# Patient Record
Sex: Male | Born: 1955 | Race: White | Hispanic: No | Smoking: Never smoker
Health system: Southern US, Community
[De-identification: ages and names within clinical notes are randomized; demographics above are authoritative.]

## PROBLEM LIST (undated history)

## (undated) DIAGNOSIS — I639 Cerebral infarction, unspecified: Secondary | ICD-10-CM

## (undated) DIAGNOSIS — I2510 Atherosclerotic heart disease of native coronary artery without angina pectoris: Secondary | ICD-10-CM

## (undated) HISTORY — PX: HERNIA REPAIR: SHX51

---

## 2017-05-20 ENCOUNTER — Emergency Department (HOSPITAL_COMMUNITY): Payer: Medicare Other

## 2017-05-20 ENCOUNTER — Emergency Department (HOSPITAL_COMMUNITY)
Admission: EM | Admit: 2017-05-20 | Discharge: 2017-05-20 | Disposition: A | Discharge status: Other Acute Inpatient Hospital | Payer: Medicare Other | Attending: Emergency Medicine | Admitting: Emergency Medicine

## 2017-05-20 ENCOUNTER — Other Ambulatory Visit: Payer: Self-pay

## 2017-05-20 ENCOUNTER — Encounter (HOSPITAL_COMMUNITY): Payer: Self-pay | Admitting: Emergency Medicine

## 2017-05-20 DIAGNOSIS — Z7902 Long term (current) use of antithrombotics/antiplatelets: Secondary | ICD-10-CM | POA: Diagnosis not present

## 2017-05-20 DIAGNOSIS — Z794 Long term (current) use of insulin: Secondary | ICD-10-CM | POA: Insufficient documentation

## 2017-05-20 DIAGNOSIS — I259 Chronic ischemic heart disease, unspecified: Secondary | ICD-10-CM | POA: Insufficient documentation

## 2017-05-20 DIAGNOSIS — E1165 Type 2 diabetes mellitus with hyperglycemia: Secondary | ICD-10-CM | POA: Insufficient documentation

## 2017-05-20 DIAGNOSIS — R739 Hyperglycemia, unspecified: Secondary | ICD-10-CM

## 2017-05-20 DIAGNOSIS — F419 Anxiety disorder, unspecified: Secondary | ICD-10-CM | POA: Insufficient documentation

## 2017-05-20 DIAGNOSIS — I10 Essential (primary) hypertension: Secondary | ICD-10-CM | POA: Diagnosis not present

## 2017-05-20 DIAGNOSIS — R45851 Suicidal ideations: Secondary | ICD-10-CM | POA: Diagnosis not present

## 2017-05-20 DIAGNOSIS — Z79899 Other long term (current) drug therapy: Secondary | ICD-10-CM | POA: Insufficient documentation

## 2017-05-20 DIAGNOSIS — F209 Schizophrenia, unspecified: Secondary | ICD-10-CM | POA: Insufficient documentation

## 2017-05-20 HISTORY — DX: Atherosclerotic heart disease of native coronary artery without angina pectoris: I25.10

## 2017-05-20 HISTORY — DX: Cerebral infarction, unspecified: I63.9

## 2017-05-20 LAB — COMPREHENSIVE METABOLIC PANEL
ALBUMIN: 3.6 g/dL (ref 3.5–5.0)
ALK PHOS: 101 U/L (ref 38–126)
ALT: 18 U/L (ref 17–63)
AST: 18 U/L (ref 15–41)
Anion gap: 11 (ref 5–15)
BILIRUBIN TOTAL: 0.9 mg/dL (ref 0.3–1.2)
BUN: 22 mg/dL — ABNORMAL HIGH (ref 6–20)
CHLORIDE: 106 mmol/L (ref 101–111)
CO2: 21 mmol/L — AB (ref 22–32)
Calcium: 9.1 mg/dL (ref 8.9–10.3)
Creatinine, Ser: 0.85 mg/dL (ref 0.61–1.24)
GFR calc Af Amer: >60 mL/min (ref >60)
GFR calc non Af Amer: >60 mL/min (ref >60)
Glucose, Bld: 254 mg/dL — ABNORMAL HIGH (ref 65–99)
Potassium: 3.6 mmol/L (ref 3.5–5.1)
Sodium: 138 mmol/L (ref 135–145)
Total Protein: 6.8 g/dL (ref 6.5–8.1)

## 2017-05-20 LAB — CBG MONITORING, ED
GLUCOSE-CAPILLARY: 249 mg/dL — AB (ref 65–99)
Glucose-Capillary: 236 mg/dL — ABNORMAL HIGH (ref 65–99)
Glucose-Capillary: 237 mg/dL — ABNORMAL HIGH (ref 65–99)

## 2017-05-20 LAB — RAPID URINE DRUG SCREEN, HOSP PERFORMED
AMPHETAMINES: NOT DETECTED
Barbiturates: NOT DETECTED
Benzodiazepines: NOT DETECTED
COCAINE: NOT DETECTED
Opiates: NOT DETECTED
TETRAHYDROCANNABINOL: NOT DETECTED

## 2017-05-20 LAB — URINALYSIS, ROUTINE W REFLEX MICROSCOPIC
Bilirubin Urine: NEGATIVE
KETONES UR: NEGATIVE mg/dL
LEUKOCYTES UA: NEGATIVE
Nitrite: NEGATIVE
Protein, ur: 100 mg/dL — AB
SQUAMOUS EPITHELIAL / LPF: NONE SEEN
Specific Gravity, Urine: 1.032 — ABNORMAL HIGH (ref 1.005–1.030)
pH: 5 (ref 5.0–8.0)

## 2017-05-20 LAB — CBC
HCT: 39.4 % (ref 39.0–52.0)
HEMOGLOBIN: 13.6 g/dL (ref 13.0–17.0)
MCH: 28.9 pg (ref 26.0–34.0)
MCHC: 34.5 g/dL (ref 30.0–36.0)
MCV: 83.8 fL (ref 78.0–100.0)
PLATELETS: 160 10*3/uL (ref 150–400)
RBC: 4.7 MIL/uL (ref 4.22–5.81)
RDW: 14.1 % (ref 11.5–15.5)
WBC: 8.8 10*3/uL (ref 4.0–10.5)

## 2017-05-20 LAB — SALICYLATE LEVEL: Salicylate Lvl: <7 mg/dL (ref 2.8–30.0)

## 2017-05-20 LAB — ACETAMINOPHEN LEVEL

## 2017-05-20 LAB — ETHANOL

## 2017-05-20 LAB — I-STAT TROPONIN, ED: Troponin i, poc: 0 ng/mL (ref 0.00–0.08)

## 2017-05-20 MED ORDER — INSULIN ASPART 100 UNIT/ML ~~LOC~~ SOLN
0.0000 [IU] | Freq: Three times a day (TID) | SUBCUTANEOUS | Status: DC
  Administered 2017-05-20 (×3): 5 [IU] via SUBCUTANEOUS

## 2017-05-20 MED ORDER — PRAZOSIN HCL 1 MG PO CAPS
1.0000 mg | ORAL_CAPSULE | Freq: Every day | ORAL | Status: DC
  Filled 2017-05-20: qty 1

## 2017-05-20 MED ORDER — DULOXETINE HCL 30 MG PO CPEP
60.0000 mg | ORAL_CAPSULE | Freq: Two times a day (BID) | ORAL | Status: DC
  Administered 2017-05-20: 60 mg via ORAL
  Filled 2017-05-20: qty 2

## 2017-05-20 MED ORDER — INSULIN GLARGINE 100 UNIT/ML ~~LOC~~ SOLN
60.0000 [IU] | Freq: Every day | SUBCUTANEOUS | Status: DC
  Filled 2017-05-20: qty 0.6

## 2017-05-20 MED ORDER — INSULIN ASPART 100 UNIT/ML ~~LOC~~ SOLN
0.0000 [IU] | Freq: Three times a day (TID) | SUBCUTANEOUS | Status: DC
  Administered 2017-05-20: 5 [IU] via SUBCUTANEOUS
  Filled 2017-05-20: qty 1

## 2017-05-20 MED ORDER — GABAPENTIN 400 MG PO CAPS
800.0000 mg | ORAL_CAPSULE | Freq: Three times a day (TID) | ORAL | Status: DC
  Administered 2017-05-20 (×2): 800 mg via ORAL
  Filled 2017-05-20 (×2): qty 2

## 2017-05-20 MED ORDER — CLOPIDOGREL BISULFATE 75 MG PO TABS
75.0000 mg | ORAL_TABLET | Freq: Every day | ORAL | Status: DC
  Administered 2017-05-20: 75 mg via ORAL
  Filled 2017-05-20: qty 1

## 2017-05-20 MED ORDER — ACETAMINOPHEN 325 MG PO TABS: 650.0000 mg | ORAL_TABLET | ORAL | Status: DC | PRN

## 2017-05-20 MED ORDER — BISMUTH SUBSALICYLATE 262 MG/15ML PO SUSP
30.0000 mL | Freq: Once | ORAL | Status: AC
  Administered 2017-05-20: 30 mL via ORAL
  Filled 2017-05-20: qty 236

## 2017-05-20 MED ORDER — TRAZODONE HCL 100 MG PO TABS: 100.0000 mg | ORAL_TABLET | Freq: Every day | ORAL | Status: DC

## 2017-05-20 MED ORDER — LISINOPRIL 10 MG PO TABS
10.0000 mg | ORAL_TABLET | Freq: Every day | ORAL | Status: DC
  Administered 2017-05-20: 10 mg via ORAL
  Filled 2017-05-20: qty 1

## 2017-05-20 MED ORDER — ATORVASTATIN CALCIUM 40 MG PO TABS
40.0000 mg | ORAL_TABLET | Freq: Every day | ORAL | Status: DC
  Administered 2017-05-20: 40 mg via ORAL
  Filled 2017-05-20: qty 1

## 2017-05-20 MED ORDER — BUPROPION HCL 75 MG PO TABS
75.0000 mg | ORAL_TABLET | Freq: Every day | ORAL | Status: DC
  Administered 2017-05-20: 75 mg via ORAL
  Filled 2017-05-20: qty 1

## 2017-05-20 MED ORDER — METFORMIN HCL 500 MG PO TABS
1000.0000 mg | ORAL_TABLET | Freq: Two times a day (BID) | ORAL | Status: DC
  Administered 2017-05-20 (×2): 1000 mg via ORAL
  Filled 2017-05-20 (×2): qty 2

## 2017-05-20 NOTE — ED Notes (Addendum)
[employer]iffs [employer] here to transport patient to [facility]
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
## 2017-05-20 NOTE — BH Assessment (Addendum)
[facility] Assessment Progress Note  Per Thedore MinsMojeed Akintayo, MD, this pt requires psychiatric hospitalization at this time.  EDP Drema PryPedro Cardama, MD, finds that this pt meets criteria for IVC, which he has initiated.  [place] [facility] [facility], and at 13:46 Hortense RamalMagistrate Watts confirms receipt.  As of this writing, service of Findings and Custody Order is pending.  At 12:35 Christiane HaJonathan calls from [facility] to report that pt has been accepted to their facility by Dr Wendall StadeKohl.  Dorise HissLaurie Britton, [facility], FNP concurs with this decision.  Pt's nurse, Diane, has been notified, and agrees to call report to [phone].  [facility].  Doylene Canninghomas [place] Behavioral Health Coordinator [phone]   Addendum:  IVC documents have been fax[facility] to [facility], per their request, and at 14:00 Christiane HaJonathan confirms receipt.  Doylene Canninghomas [place] Hijazi, [place] Behavioral Health Coordinator [phone]

## 2017-05-20 NOTE — ED Notes (Signed)
Pt calm and cooperative. Somewhat guarded and irritable. Affect blunted, mood depressed.
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
## 2017-05-20 NOTE — ED Notes (Signed)
Called report to [facility] and Called [facility] for transport. Sheriff said that it would be several hours before they can transport.

## 2017-05-20 NOTE — ED Triage Notes (Signed)
[place]  EMS was called out by [employer] for an unconscious person  [employer] thought the pt threw himself on the ground  Pt was alert and oriented x 4 when EMS arrived  Pt states he blacks out when he gets really anxious  Pt threw his body against EMS personal during their assessment then to the floor  Pt states [facility] under [facility] pretenses  The man never picked him up when he arrived here  Pt wants to be seen for his anxiety  Pt has not eaten or had any water to take his medications today

## 2017-05-20 NOTE — ED Provider Notes (Signed)
Patient's EMTALA has been updated.  He is going to [facility].  He has remained stable and appears well overall stable for transport.   Pricilla LovelessGoldston, Jayda White, MD 05/20/17 [phone]

## 2017-05-20 NOTE — ED Triage Notes (Addendum)
Pt reports having severe anxiety attack. Pt also reports having suicidal ideation of overdosing.

## 2019-01-06 IMAGING — CR DG FOOT COMPLETE 3+V*R*
3 series · 3 of 3 positions shown · non-contrast
Comparison: None.

CLINICAL DATA: Right foot neuropathy.

EXAM:
RIGHT FOOT COMPLETE - 3+ VIEW

[x foot ap right]
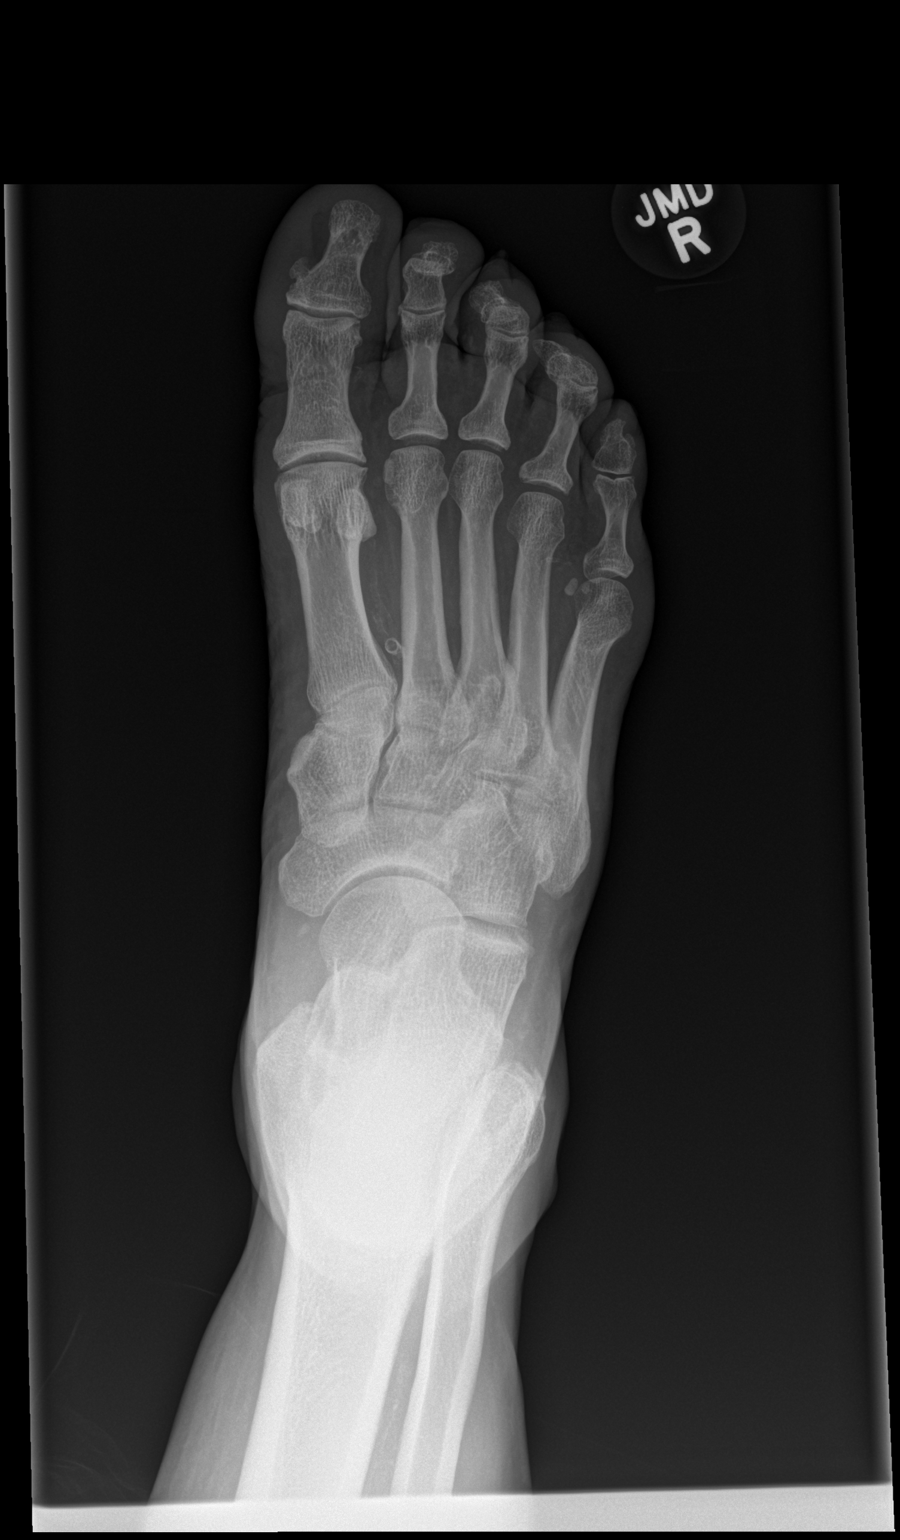

[x foot obl right]
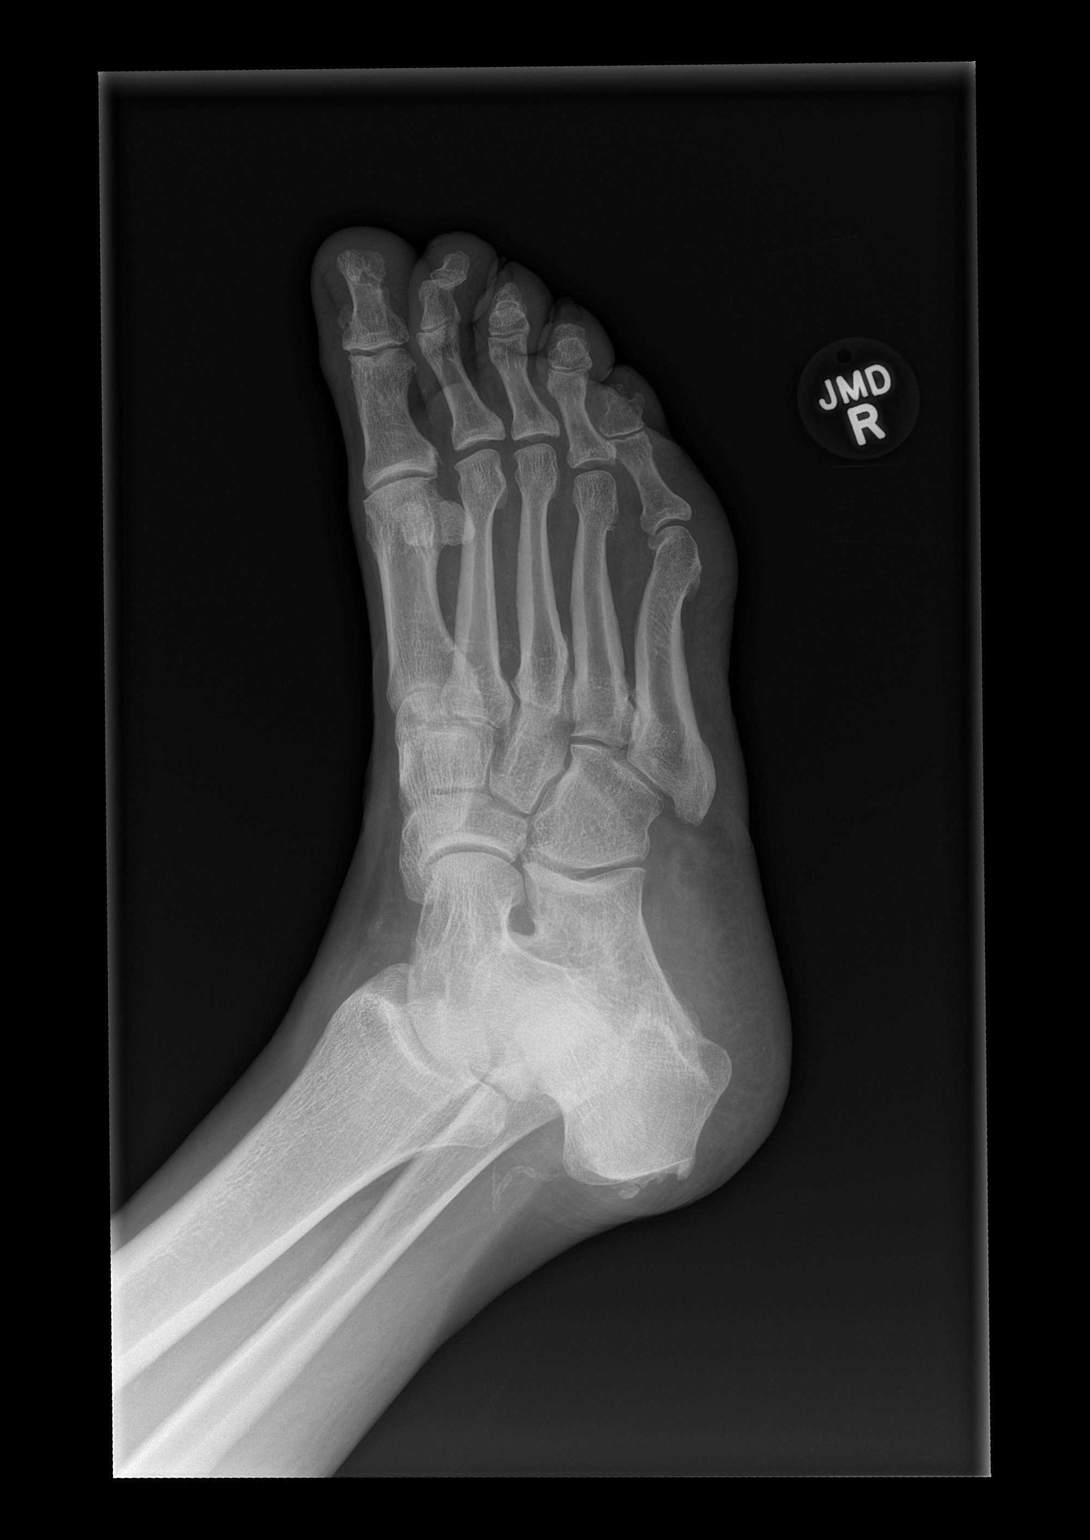

[x foot lat right]
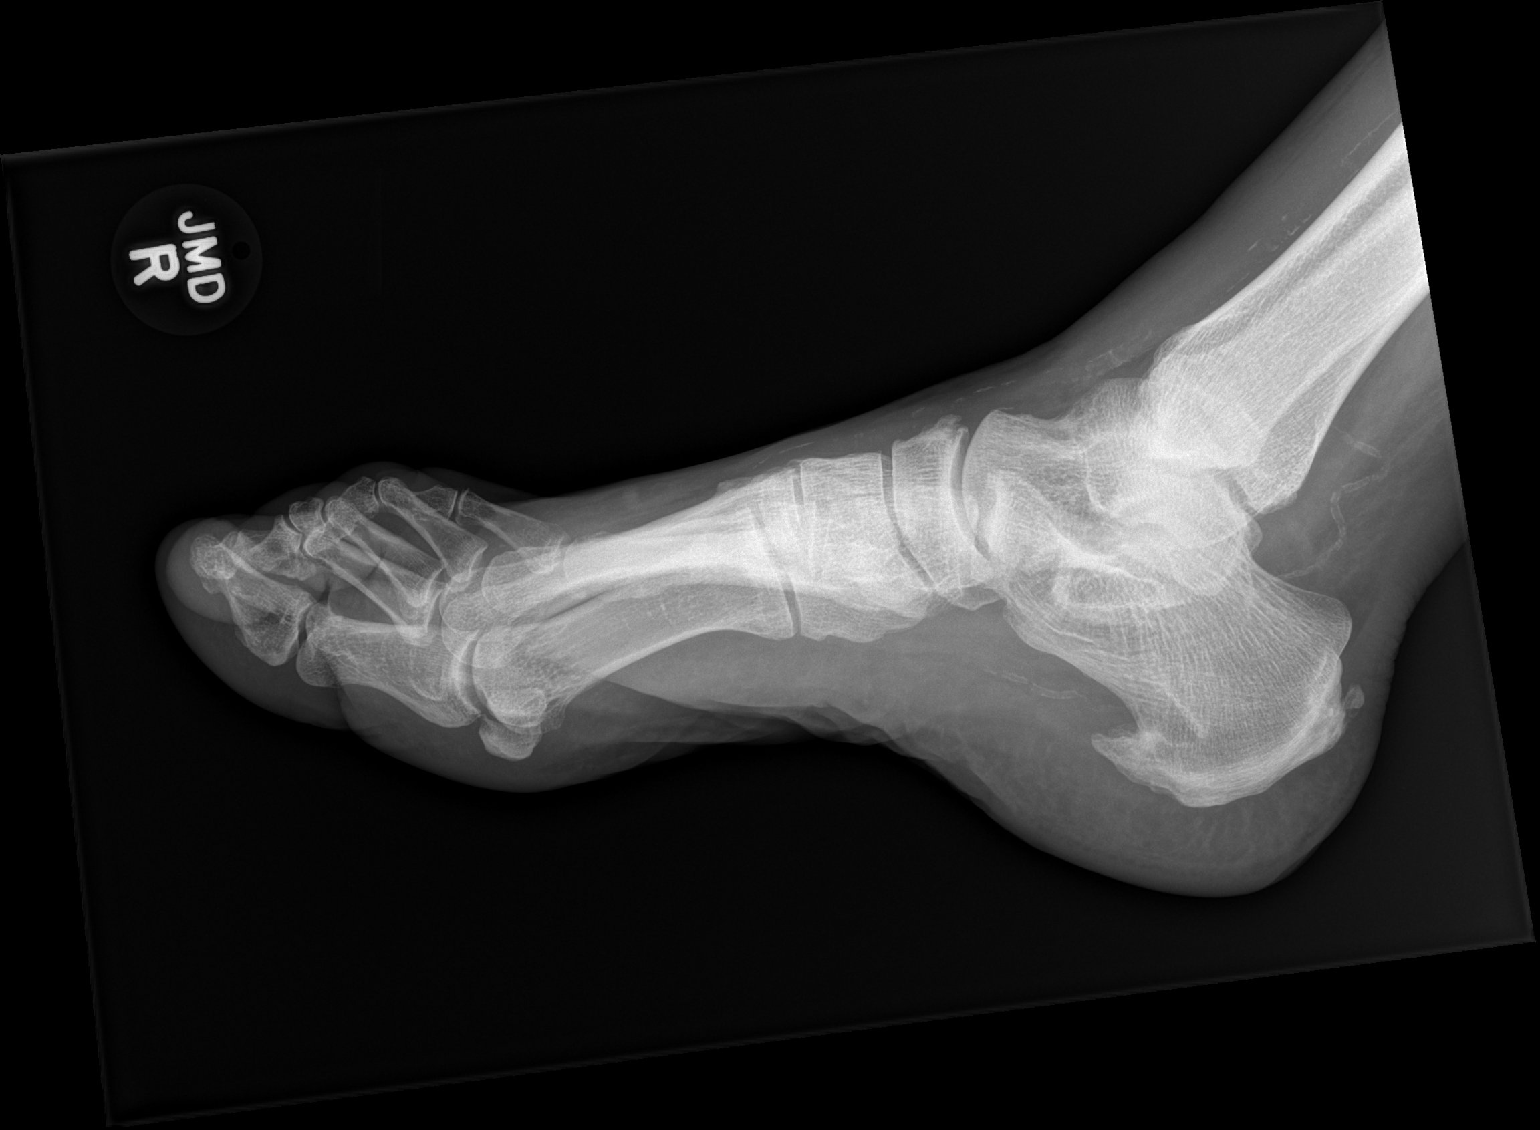

[3 of 3 positions shown; findings below may reference images not displayed]

FINDINGS: There is no evidence of fracture or dislocation. There is no
evidence of arthropathy. Vascular calcifications are noted. Moderate
posterior calcaneal spurring is noted.
IMPRESSION: No acute abnormality seen in the right foot.

## 2019-02-06 DEATH — deceased
